# Patient Record
Sex: Female | Born: 1965 | Race: White | Hispanic: No | Marital: Single | Smoking: Never smoker
Health system: Southern US, Community
[De-identification: ages and names within clinical notes are randomized; demographics above are authoritative.]

## PROBLEM LIST (undated history)

## (undated) DIAGNOSIS — R011 Cardiac murmur, unspecified: Secondary | ICD-10-CM

## (undated) DIAGNOSIS — E785 Hyperlipidemia, unspecified: Secondary | ICD-10-CM

## (undated) DIAGNOSIS — R911 Solitary pulmonary nodule: Secondary | ICD-10-CM

## (undated) DIAGNOSIS — I2510 Atherosclerotic heart disease of native coronary artery without angina pectoris: Secondary | ICD-10-CM

## (undated) DIAGNOSIS — K760 Fatty (change of) liver, not elsewhere classified: Secondary | ICD-10-CM

## (undated) HISTORY — DX: Fatty (change of) liver, not elsewhere classified: K76.0

## (undated) HISTORY — DX: Solitary pulmonary nodule: R91.1

## (undated) HISTORY — DX: Atherosclerotic heart disease of native coronary artery without angina pectoris: I25.10

## (undated) HISTORY — DX: Cardiac murmur, unspecified: R01.1

## (undated) HISTORY — DX: Hyperlipidemia, unspecified: E78.5

---

## 2009-03-18 ENCOUNTER — Ambulatory Visit: Payer: Self-pay | Admitting: Sports Medicine

## 2009-03-18 DIAGNOSIS — M25579 Pain in unspecified ankle and joints of unspecified foot: Secondary | ICD-10-CM

## 2009-03-18 DIAGNOSIS — M2140 Flat foot [pes planus] (acquired), unspecified foot: Secondary | ICD-10-CM | POA: Insufficient documentation

## 2009-04-23 DIAGNOSIS — R269 Unspecified abnormalities of gait and mobility: Secondary | ICD-10-CM

## 2009-04-28 ENCOUNTER — Encounter: Payer: Self-pay | Admitting: Sports Medicine

## 2009-09-28 ENCOUNTER — Ambulatory Visit: Payer: Self-pay | Admitting: Sports Medicine

## 2009-10-26 ENCOUNTER — Ambulatory Visit: Payer: Self-pay | Admitting: Sports Medicine

## 2009-12-17 ENCOUNTER — Encounter: Payer: Self-pay | Admitting: Sports Medicine

## 2009-12-23 ENCOUNTER — Other Ambulatory Visit: Admission: RE | Admit: 2009-12-23 | Discharge: 2009-12-23 | Payer: Self-pay | Admitting: Family Medicine

## 2010-01-24 ENCOUNTER — Ambulatory Visit (HOSPITAL_COMMUNITY): Admission: RE | Admit: 2010-01-24 | Discharge: 2010-01-24 | Payer: Self-pay | Admitting: Family Medicine

## 2010-09-13 NOTE — Assessment & Plan Note (Signed)
Summary: Denise James,MC   Vital Signs:  Patient profile:   45 year old female Height:      66 inches Weight:      120 pounds BP sitting:   96 / 68  Vitals Entered By: Lillia Pauls CMA (September 28, 2009 11:06 AM)  History of Present Illness: Pt returns because of bilateral foot pain since July of 2010. She was last seen for achilles tendinitis but feels that this has gotten better. However, her pain is more generalized throughout her heels bilaterally including along the plantar surface of her heel and throughout her medial and lateral maleoli. She is unable to find a particularly painful region. She is now unable to run at all because of this shooting pain that usually occurs after she sits down and rests after a run. When she rests completely, she does not have the pain but as soon as she starts to run again, the pain returns. She denies any recent injuries. She recently compoeted a half marathon and iced after that to try to prevent the pain. She has not taken any medications for the pain. She is using the same type of shoes. Wants to do the [facility] marathon in October.   Physical Exam  General:  alert and well-developed.   Head:  normocephalic and atraumatic.   Msk:  Right Foot and Ankle: No bony abnormalities, edema or bruising Full ROM of her ankle No TTP specifically throughout. No plantar or achilles tendon tenderness that is significant Neg tap test along medial and lateral maleoli No TTP along the talofibular ligament 5/5 strength with resisted ROM Neg anterior drawer sign, neg talar tilt test Able to walk without pain  Left Foot and Ankle: No bony abnormalities, edema or bruising Full ROM of her ankle No TTP specifically throughout. No plantar or achilles tendon tenderness that is significant Neg tap test along medial and lateral maleoli No TTP along the talofibular ligament 5/5 strength with resisted ROM Neg anterior drawer sign, neg talar tilt test Able to walk without  pain  Bilateral mild pes planus Left foot slightly suppinates during running gait. Otherwise, good running form.    Impression & Recommendations:  Problem # 1:  ANKLE PAIN, BILATERAL (ICD-719.47) Assessment Unchanged Likley from mechanical stresses during running 1. Will set up for custom orthotics since she is already in sports insoles but may need further support 2. Ice feet as needed for 20 minutes after running 3. Can take OTC NSAIDS as needed 4. Do calf and ankle strengthening exercises as directed  Problem # 2:  PES PLANUS (ICD-734) Mild but is contributing to her persistent pain during running 1. Correct with custom orthotics  Problem # 3:  ABNORMALITY OF GAIT (ICD-781.2) Slight supination of left foot when running 1. Custom orthotics  Patient Instructions: 1)  Do the modified running lunges - first start without any weights in your hands then you can add small hand weights (up to 10lbs). KEEP YOUR RUNNING FORM while you are doing the lunges.  Do three sets 10, then work up to three sets of 15. Three times per week. 2)  Do drop squats - lift up your heels and then a sudden downward motion to recreate the look of your knees when you are running. Do this with the weight bar on the back of your neck (behind your head). Do three sets of 10. Build up to 3 sets of 15.  3)  Continue the hip exercises (rotation, leg raises). Daily. 4)  Continue  the calf muscle exercises for your achilles tendon. Daily 5)  Continue your running, and after you finish do some bounding drills where you overstride going forward for 75 yards. Do this 5-8 times after the run 3 times per week.

## 2010-09-13 NOTE — Letter (Signed)
Summary: Generic Letter  [facility]  [address]   [place], [place] [address]   Phone: [phone]  Fax: [phone]    12/17/2009 Medical Director [facility]  reZain Bingman [facility] [address] [place], [place]  [address]    Dear Medical Director:  I am writing this letter to support the appeal of Ms. Lorenso Courier for coverage of her orthotics made at our [facility].  The need for the orthotics was based on the following diagnosis as listed on her visit:  Bilateral Ankle Pain Abnormaility of gait    The biomechanical issues this patient had with persistent pronation are likely causal of her ongoing symptoms.  Pes planus was listed as a primary diagnosis but this was an error and this should have been listed as a physical finding not as a primary diagnosis in spite of fact her long arch breakdown is likely acquired and not congenital.  I hope you will reconsider her appeal for this charge as her visit seems entirely medically appropriate to me.    Sincerely,     Sibyl Parr. Darrick Penna, MD Professor  Manufacturing engineer of [facility]

## 2010-09-13 NOTE — Assessment & Plan Note (Signed)
Summary: ORTHOTICS/MJD   Vital Signs:  Patient profile:   45 year old female BP sitting:   112 / 73  Vitals Entered By: Lillia Pauls CMA (October 26, 2009 10:54 AM)  History of Present Illness: Karon has had chronic ankle and some recurrent achilles pain  she is planning to run marathons and train consistently  she did benefit from temp sports insoles wiht heel lifts no swelling in ankles or AT today no real pain today  comes for custom orthotics to see if these will allow her to keep up running with less ankle and AT pain  Physical Exam  General:  Well-developed,well-nourished,in no acute distress; alert,appropriate and cooperative throughout examination Msk:  Rt foot more pronated than left left slight pronation transverse arch is down slightly but no abnorm calluses AT non tender ankles not swollen today   Impression & Recommendations:  Problem # 1:  ABNORMALITY OF GAIT (ICD-781.2)  Patient was fitted for a standard, cushioned, semi-rigid orthotic.  The orthotic was heated and the patient stood on the orthotic blank positioned on the orthotic stand. The patient was positioned in subtalar neutral position and 10 degrees of ankle dorsiflexion in a weight bearing stance. After completion of molding a stable based was applied to the orthotic blank.   The blank was ground to a stable position for weight bearing. size 8 red cambray base blue small med density EVA posting  none additional orthotic padding none  after completion  gait looks normal neutral pronation some resting supination knees no longer cross midline less excess low ext motion  Orders: [employer], each unit (E4540)  Problem # 2:  ANKLE PAIN, BILATERAL (ICD-719.47)  the orthotics corrected the side to side shift at the ankles they keep her in neutral and block pronation  hopefully this will be helpful  Orders: [employer], each unit (J8119)  Problem # 3:  PES PLANUS  (ICD-734)  Orders: [employer], each unit (L3002)  good correction with orthotics  will reck as needed expect 2 to 3 yrs from orthotics

## 2011-02-23 ENCOUNTER — Other Ambulatory Visit (HOSPITAL_COMMUNITY): Payer: Self-pay | Admitting: Family Medicine

## 2011-02-23 DIAGNOSIS — Z1231 Encounter for screening mammogram for malignant neoplasm of breast: Secondary | ICD-10-CM

## 2011-03-06 ENCOUNTER — Ambulatory Visit (HOSPITAL_COMMUNITY)
Admission: RE | Admit: 2011-03-06 | Discharge: 2011-03-06 | Disposition: A | Discharge status: Home / Self Care | Payer: BC Managed Care – PPO | Source: Ambulatory Visit | Attending: Family Medicine | Admitting: Family Medicine

## 2011-03-06 DIAGNOSIS — Z1231 Encounter for screening mammogram for malignant neoplasm of breast: Secondary | ICD-10-CM

## 2011-03-09 ENCOUNTER — Other Ambulatory Visit: Payer: Self-pay | Admitting: Family Medicine

## 2011-03-09 DIAGNOSIS — R928 Other abnormal and inconclusive findings on diagnostic imaging of breast: Secondary | ICD-10-CM

## 2011-03-15 ENCOUNTER — Other Ambulatory Visit: Payer: BC Managed Care – PPO

## 2011-03-16 ENCOUNTER — Ambulatory Visit
Admission: RE | Admit: 2011-03-16 | Discharge: 2011-03-16 | Disposition: A | Discharge status: Home / Self Care | Payer: BC Managed Care – PPO | Source: Ambulatory Visit | Attending: Family Medicine | Admitting: Family Medicine

## 2011-03-16 DIAGNOSIS — R928 Other abnormal and inconclusive findings on diagnostic imaging of breast: Secondary | ICD-10-CM

## 2012-04-12 ENCOUNTER — Other Ambulatory Visit: Payer: Self-pay | Admitting: Family Medicine

## 2012-04-12 DIAGNOSIS — Z1231 Encounter for screening mammogram for malignant neoplasm of breast: Secondary | ICD-10-CM

## 2012-04-29 ENCOUNTER — Ambulatory Visit
Admission: RE | Admit: 2012-04-29 | Discharge: 2012-04-29 | Disposition: A | Discharge status: Home / Self Care | Payer: BC Managed Care – PPO | Source: Ambulatory Visit | Attending: Family Medicine | Admitting: Family Medicine

## 2012-04-29 DIAGNOSIS — Z1231 Encounter for screening mammogram for malignant neoplasm of breast: Secondary | ICD-10-CM

## 2012-06-14 ENCOUNTER — Other Ambulatory Visit (HOSPITAL_COMMUNITY)
Admission: RE | Admit: 2012-06-14 | Discharge: 2012-06-14 | Disposition: A | Discharge status: Home / Self Care | Payer: BC Managed Care – PPO | Source: Ambulatory Visit | Attending: Family Medicine | Admitting: Family Medicine

## 2012-06-14 DIAGNOSIS — Z01419 Encounter for gynecological examination (general) (routine) without abnormal findings: Secondary | ICD-10-CM | POA: Insufficient documentation

## 2014-10-19 ENCOUNTER — Other Ambulatory Visit: Payer: Self-pay

## 2014-10-19 DIAGNOSIS — Z1231 Encounter for screening mammogram for malignant neoplasm of breast: Secondary | ICD-10-CM

## 2014-11-19 ENCOUNTER — Ambulatory Visit
Admission: RE | Admit: 2014-11-19 | Discharge: 2014-11-19 | Disposition: A | Discharge status: Home / Self Care | Payer: BLUE CROSS/BLUE SHIELD | Source: Ambulatory Visit

## 2014-11-19 ENCOUNTER — Ambulatory Visit: Payer: Self-pay

## 2014-11-19 DIAGNOSIS — Z1231 Encounter for screening mammogram for malignant neoplasm of breast: Secondary | ICD-10-CM

## 2015-10-04 ENCOUNTER — Other Ambulatory Visit (HOSPITAL_COMMUNITY)
Admission: RE | Admit: 2015-10-04 | Discharge: 2015-10-04 | Disposition: A | Discharge status: Home / Self Care | Payer: BLUE CROSS/BLUE SHIELD | Source: Ambulatory Visit | Attending: Family Medicine | Admitting: Family Medicine

## 2015-10-04 ENCOUNTER — Other Ambulatory Visit: Payer: Self-pay | Admitting: Family Medicine

## 2015-10-04 DIAGNOSIS — Z1151 Encounter for screening for human papillomavirus (HPV): Secondary | ICD-10-CM | POA: Diagnosis present

## 2015-10-04 DIAGNOSIS — Z01419 Encounter for gynecological examination (general) (routine) without abnormal findings: Secondary | ICD-10-CM | POA: Insufficient documentation

## 2015-10-05 LAB — CYTOLOGY - PAP

## 2016-02-14 ENCOUNTER — Other Ambulatory Visit: Payer: Self-pay | Admitting: Family Medicine

## 2016-02-14 DIAGNOSIS — Z1231 Encounter for screening mammogram for malignant neoplasm of breast: Secondary | ICD-10-CM

## 2016-02-16 ENCOUNTER — Ambulatory Visit
Admission: RE | Admit: 2016-02-16 | Discharge: 2016-02-16 | Disposition: A | Discharge status: Home / Self Care | Payer: BLUE CROSS/BLUE SHIELD | Source: Ambulatory Visit | Attending: Family Medicine | Admitting: Family Medicine

## 2016-02-16 DIAGNOSIS — Z1231 Encounter for screening mammogram for malignant neoplasm of breast: Secondary | ICD-10-CM

## 2016-10-06 ENCOUNTER — Other Ambulatory Visit: Payer: Self-pay | Admitting: Family Medicine

## 2016-10-06 ENCOUNTER — Other Ambulatory Visit (HOSPITAL_COMMUNITY)
Admission: RE | Admit: 2016-10-06 | Discharge: 2016-10-06 | Disposition: A | Discharge status: Home / Self Care | Payer: PRIVATE HEALTH INSURANCE | Source: Ambulatory Visit | Attending: Family Medicine | Admitting: Family Medicine

## 2016-10-06 DIAGNOSIS — Z1151 Encounter for screening for human papillomavirus (HPV): Secondary | ICD-10-CM | POA: Diagnosis not present

## 2016-10-06 DIAGNOSIS — Z01411 Encounter for gynecological examination (general) (routine) with abnormal findings: Secondary | ICD-10-CM | POA: Diagnosis present

## 2016-10-10 LAB — CYTOLOGY - PAP
Diagnosis: 0
Diagnosis: NEGATIVE
HPV (WINDOPATH): DETECTED — AB

## 2017-08-21 ENCOUNTER — Other Ambulatory Visit: Payer: Self-pay | Admitting: Family Medicine

## 2017-08-21 DIAGNOSIS — Z1231 Encounter for screening mammogram for malignant neoplasm of breast: Secondary | ICD-10-CM

## 2017-09-13 ENCOUNTER — Ambulatory Visit
Admission: RE | Admit: 2017-09-13 | Discharge: 2017-09-13 | Disposition: A | Discharge status: Home / Self Care | Payer: PRIVATE HEALTH INSURANCE | Source: Ambulatory Visit | Attending: Family Medicine | Admitting: Family Medicine

## 2017-09-13 DIAGNOSIS — Z1231 Encounter for screening mammogram for malignant neoplasm of breast: Secondary | ICD-10-CM

## 2017-10-09 ENCOUNTER — Other Ambulatory Visit: Payer: Self-pay | Admitting: Family Medicine

## 2017-10-09 ENCOUNTER — Other Ambulatory Visit (HOSPITAL_COMMUNITY)
Admission: RE | Admit: 2017-10-09 | Discharge: 2017-10-09 | Disposition: A | Discharge status: Home / Self Care | Payer: PRIVATE HEALTH INSURANCE | Source: Ambulatory Visit | Attending: Family Medicine | Admitting: Family Medicine

## 2017-10-09 DIAGNOSIS — Z01411 Encounter for gynecological examination (general) (routine) with abnormal findings: Secondary | ICD-10-CM | POA: Insufficient documentation

## 2017-10-11 LAB — CYTOLOGY - PAP: HPV: DETECTED — AB

## 2017-10-29 ENCOUNTER — Other Ambulatory Visit: Payer: Self-pay | Admitting: Obstetrics and Gynecology

## 2018-01-28 ENCOUNTER — Other Ambulatory Visit: Payer: Self-pay | Admitting: Family Medicine

## 2018-01-28 DIAGNOSIS — R1011 Right upper quadrant pain: Secondary | ICD-10-CM

## 2018-02-04 ENCOUNTER — Ambulatory Visit
Admission: RE | Admit: 2018-02-04 | Discharge: 2018-02-04 | Disposition: A | Discharge status: Home / Self Care | Payer: PRIVATE HEALTH INSURANCE | Source: Ambulatory Visit | Attending: Family Medicine | Admitting: Family Medicine

## 2018-02-04 DIAGNOSIS — R1011 Right upper quadrant pain: Secondary | ICD-10-CM

## 2018-02-05 ENCOUNTER — Other Ambulatory Visit: Payer: PRIVATE HEALTH INSURANCE

## 2018-11-04 ENCOUNTER — Other Ambulatory Visit: Payer: Self-pay | Admitting: Family Medicine

## 2018-11-04 ENCOUNTER — Other Ambulatory Visit: Payer: Self-pay | Admitting: Obstetrics and Gynecology

## 2018-11-04 ENCOUNTER — Other Ambulatory Visit (HOSPITAL_COMMUNITY)
Admission: RE | Admit: 2018-11-04 | Discharge: 2018-11-04 | Disposition: A | Discharge status: Home / Self Care | Payer: PRIVATE HEALTH INSURANCE | Source: Ambulatory Visit | Attending: Obstetrics and Gynecology | Admitting: Obstetrics and Gynecology

## 2018-11-04 DIAGNOSIS — Z1231 Encounter for screening mammogram for malignant neoplasm of breast: Secondary | ICD-10-CM

## 2018-11-04 DIAGNOSIS — Z01419 Encounter for gynecological examination (general) (routine) without abnormal findings: Secondary | ICD-10-CM | POA: Insufficient documentation

## 2018-11-06 LAB — CYTOLOGY - PAP
Diagnosis: UNDETERMINED — AB
HPV: DETECTED — AB

## 2018-12-10 ENCOUNTER — Other Ambulatory Visit: Payer: Self-pay | Admitting: Obstetrics and Gynecology

## 2018-12-27 ENCOUNTER — Ambulatory Visit: Payer: PRIVATE HEALTH INSURANCE

## 2019-01-17 ENCOUNTER — Other Ambulatory Visit: Payer: Self-pay

## 2019-01-17 ENCOUNTER — Ambulatory Visit
Admission: RE | Admit: 2019-01-17 | Discharge: 2019-01-17 | Disposition: A | Discharge status: Home / Self Care | Payer: PRIVATE HEALTH INSURANCE | Source: Ambulatory Visit | Attending: Family Medicine | Admitting: Family Medicine

## 2019-01-17 DIAGNOSIS — Z1231 Encounter for screening mammogram for malignant neoplasm of breast: Secondary | ICD-10-CM

## 2019-12-06 ENCOUNTER — Ambulatory Visit: Payer: Self-pay | Attending: Internal Medicine

## 2019-12-06 DIAGNOSIS — Z23 Encounter for immunization: Secondary | ICD-10-CM

## 2019-12-06 NOTE — Progress Notes (Signed)
   Covid-19 Vaccination Clinic  Name:  SEHER SCHLAGEL    MRN: [id number] DOB: Jan 30, 1966  12/06/2019  Ms. Muma was observed post Covid-19 immunization for 15 minutes without incident. She was provided with Vaccine Information Sheet and instruction to access the V-Safe system.   Ms. Vukelich was instructed to call 911 with any severe reactions post vaccine: Marland Kitchen Difficulty breathing  . Swelling of face and throat  . A fast heartbeat  . A bad rash all over body  . Dizziness and weakness   Immunizations Administered    Name Date Dose VIS Date Route   Pfizer COVID-19 Vaccine 12/06/2019 10:54 AM 0.3 mL 10/08/2018 Intramuscular   Manufacturer: [employer], [employer]   Lot: [id number]   NDC: [address]-4

## 2019-12-29 ENCOUNTER — Ambulatory Visit: Payer: Self-pay | Attending: Internal Medicine

## 2019-12-29 DIAGNOSIS — Z23 Encounter for immunization: Secondary | ICD-10-CM

## 2019-12-29 NOTE — Progress Notes (Signed)
   Covid-19 Vaccination Clinic  Name:  Denise James    MRN: [id number] DOB: October 28, 1965  12/29/2019  [name] was observed post Covid-19 immunization for 15 minutes without incident. She was provided with Vaccine Information Sheet and instruction to access the V-Safe system.   [name] was instructed to call 911 with any severe reactions post vaccine: Marland Kitchen Difficulty breathing  . Swelling of face and throat  . A fast heartbeat  . A bad rash all over body  . Dizziness and weakness   Immunizations Administered    Name Date Dose VIS Date Route   Pfizer COVID-19 Vaccine 12/29/2019 11:54 AM 0.3 mL 10/08/2018 Intramuscular   Manufacturer: [employer], [employer]   Lot: [id number]   NDC: [address]-0

## 2020-05-12 ENCOUNTER — Other Ambulatory Visit: Payer: Self-pay | Admitting: Family Medicine

## 2020-05-12 DIAGNOSIS — Z1231 Encounter for screening mammogram for malignant neoplasm of breast: Secondary | ICD-10-CM

## 2020-05-26 ENCOUNTER — Other Ambulatory Visit: Payer: Self-pay

## 2020-05-26 ENCOUNTER — Ambulatory Visit
Admission: RE | Admit: 2020-05-26 | Discharge: 2020-05-26 | Disposition: A | Discharge status: Home / Self Care | Payer: 59 | Source: Ambulatory Visit

## 2020-05-26 DIAGNOSIS — Z1231 Encounter for screening mammogram for malignant neoplasm of breast: Secondary | ICD-10-CM

## 2022-01-13 ENCOUNTER — Other Ambulatory Visit: Payer: Self-pay | Admitting: Obstetrics and Gynecology

## 2022-01-13 ENCOUNTER — Other Ambulatory Visit (HOSPITAL_COMMUNITY)
Admission: RE | Admit: 2022-01-13 | Discharge: 2022-01-13 | Disposition: A | Discharge status: Home / Self Care | Payer: Managed Care, Other (non HMO) | Source: Ambulatory Visit | Attending: Obstetrics and Gynecology | Admitting: Obstetrics and Gynecology

## 2022-01-13 DIAGNOSIS — Z01419 Encounter for gynecological examination (general) (routine) without abnormal findings: Secondary | ICD-10-CM | POA: Insufficient documentation

## 2022-01-17 LAB — CYTOLOGY - PAP
Comment: NEGATIVE
Diagnosis: NEGATIVE
High risk HPV: NEGATIVE

## 2022-07-19 IMAGING — MG DIGITAL SCREENING BILAT W/ TOMO W/ CAD
8 series · 9 of 24 positions shown · non-contrast
Comparison: Previous exam(s).

CLINICAL DATA: Screening.

EXAM:
DIGITAL SCREENING BILATERAL MAMMOGRAM WITH TOMO AND CAD

[R MLO synth-2D]
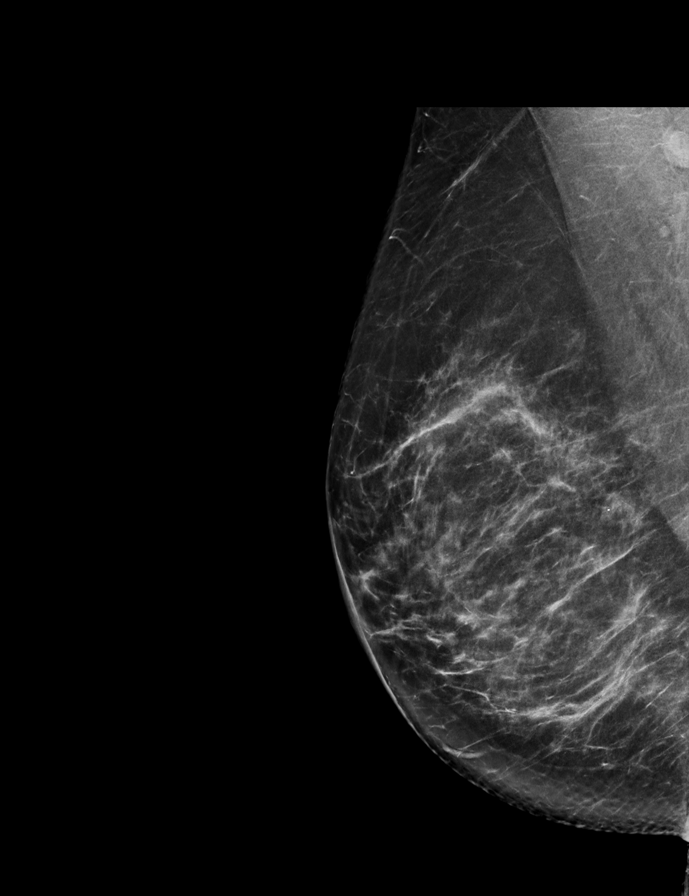

[L MLO synth-2D]
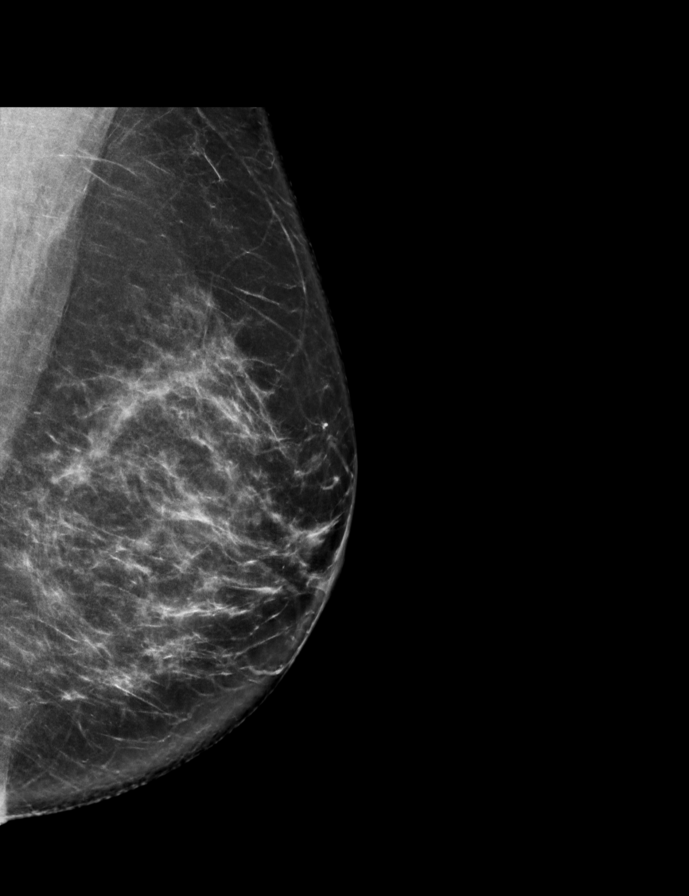

[L CC synth-2D]
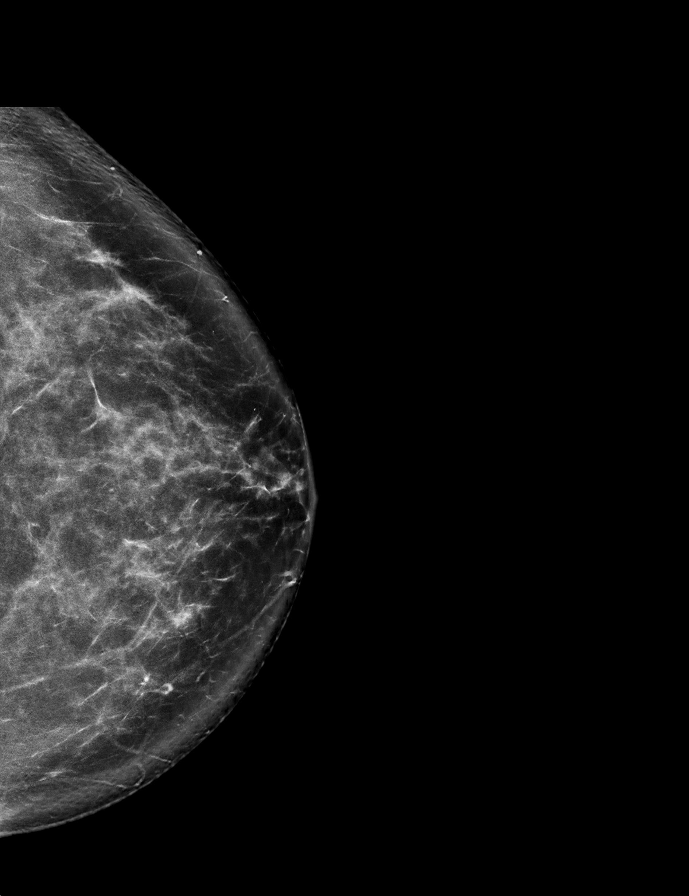

[R CC synth-2D]
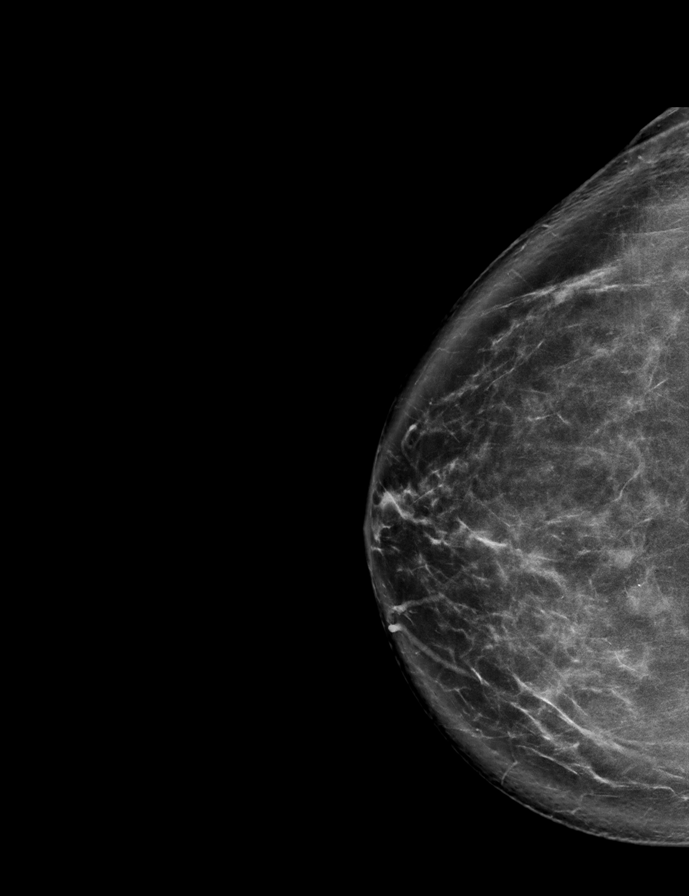

[R CC tomo · 2 of 91 frames shown]
[frame 30/91]
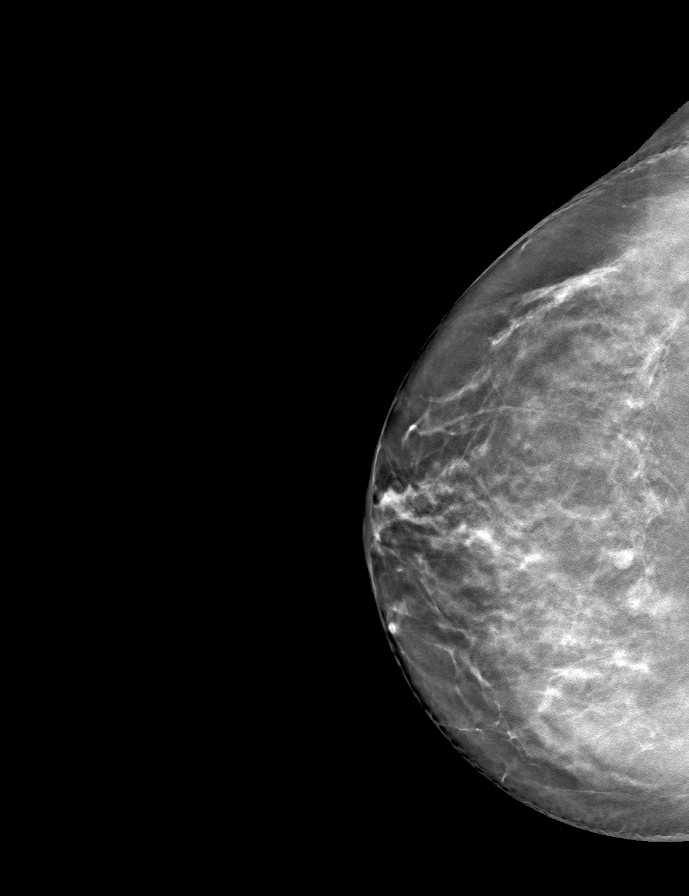
[frame 46/91]
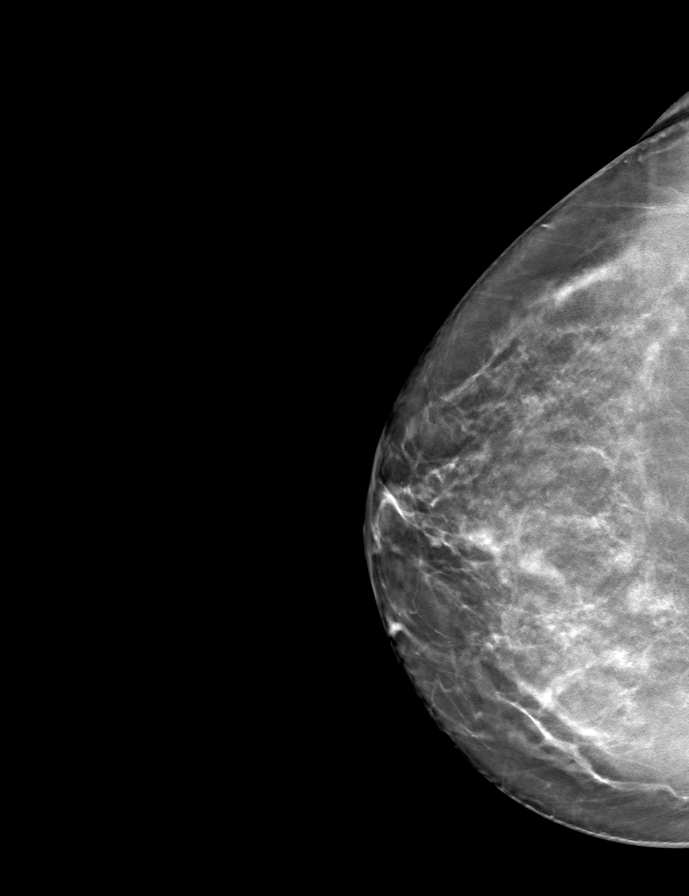

[R MLO tomo · tomo slice 41/80.0]
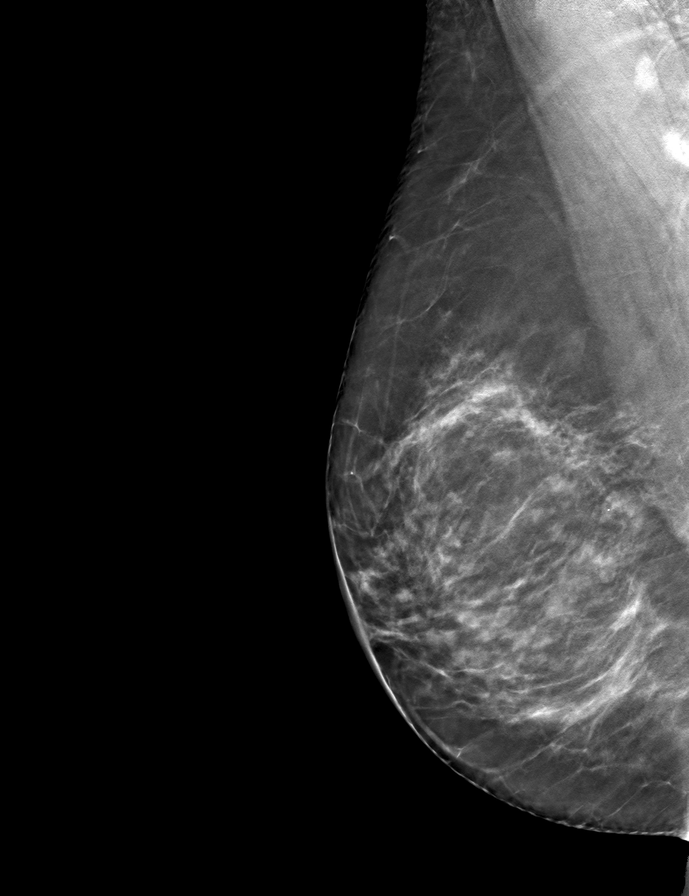

[L MLO tomo · tomo slice 41/81.0]
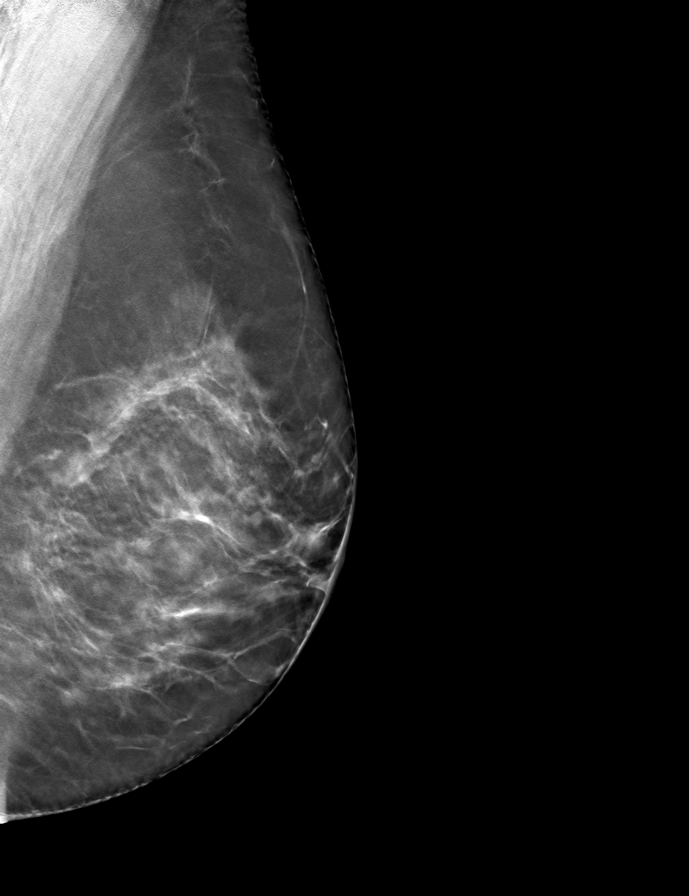

[L CC tomo · tomo slice 43/85.0]
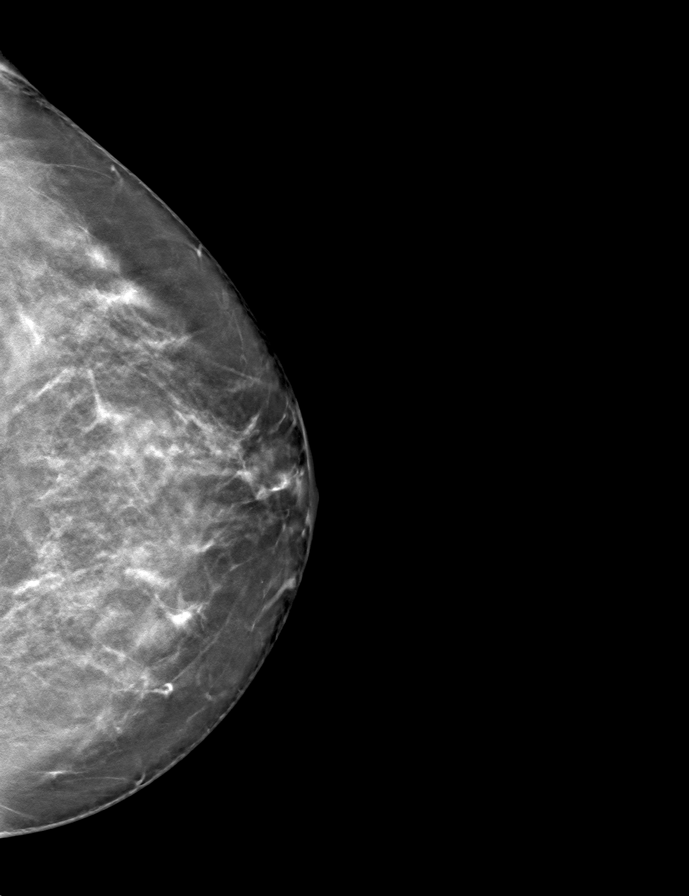

[9 of 24 positions shown; findings below may reference images not displayed]

ACR Breast Density Category b: There are scattered areas of
fibroglandular density.
FINDINGS: There are no findings suspicious for malignancy. Images were
processed with CAD.
IMPRESSION: No mammographic evidence of malignancy. A result letter of this
screening mammogram will be mailed directly to the patient.

RECOMMENDATION:
Screening mammogram in one year. (Code:[id number])

BI-RADS CATEGORY  1: Negative.

## 2023-12-17 ENCOUNTER — Other Ambulatory Visit (HOSPITAL_BASED_OUTPATIENT_CLINIC_OR_DEPARTMENT_OTHER): Payer: Self-pay | Admitting: Family Medicine

## 2023-12-17 DIAGNOSIS — E785 Hyperlipidemia, unspecified: Secondary | ICD-10-CM

## 2023-12-28 ENCOUNTER — Ambulatory Visit (HOSPITAL_COMMUNITY)
Admission: RE | Admit: 2023-12-28 | Discharge: 2023-12-28 | Disposition: A | Discharge status: Home / Self Care | Payer: Self-pay | Source: Ambulatory Visit | Attending: Family Medicine | Admitting: Family Medicine

## 2023-12-28 DIAGNOSIS — E785 Hyperlipidemia, unspecified: Secondary | ICD-10-CM | POA: Insufficient documentation

## 2024-02-29 ENCOUNTER — Other Ambulatory Visit: Payer: Self-pay | Admitting: Family Medicine

## 2024-02-29 DIAGNOSIS — R911 Solitary pulmonary nodule: Secondary | ICD-10-CM

## 2024-04-11 ENCOUNTER — Ambulatory Visit
Admission: RE | Admit: 2024-04-11 | Discharge: 2024-04-11 | Disposition: A | Discharge status: Home / Self Care | Source: Ambulatory Visit | Attending: Family Medicine | Admitting: Family Medicine

## 2024-04-11 ENCOUNTER — Other Ambulatory Visit

## 2024-04-11 DIAGNOSIS — R911 Solitary pulmonary nodule: Secondary | ICD-10-CM

## 2024-04-15 NOTE — Progress Notes (Unsigned)
 Cardiology Office Note:  .   Date:  04/16/2024 ID:  Denise James, DOB [birth year], MRN [id number] PCP: Dyane Anthony RAMAN, FNP [facility] Providers Cardiologist:  None { Click to update primary MD,subspecialty MD or APP then REFRESH:1}   Patient Profile: .      PMH Coronary artery calcification CT Calcium score 12/28/2023 CAC score 43 (84th percentile) LM 0, LAD 0, LCx 0, RCA 43 Fatty liver Pulmonary nodule Hyperlipidemia Insomnia Heart murmur 2005 Echo/stress test revealed trace AI       History of Present Illness: .   Denise James is a *** 58 y.o. female  who is here today for new patient consult for ***  Calcium score of 0 at age 1 Sister has bicuspid aortic valve CRP was high several years ago  Atorvastatin 1 month  Family history: Her family history includes Alzheimer's disease in her father; CAD (age of onset: 32 - 19) in her mother; Heart failure in her mother; Hyperlipidemia in her brother; Valvular heart disease in her sister.  Oldest brother - cardiology work up, all good, on statin  Discussed the use of AI scribe software for clinical note transcription with the patient, who gave verbal consent to proceed.  ASCVD Risk Score: ASCVD (Atherosclerotic Cardiovascular Disease) Risk Algorithm including Known ASCVD from AHA/ACC from [email or web address] on 04/15/2024 ** All calculations should be rechecked by clinician prior to use **  RESULT SUMMARY: 2.0 % Risk of cardiovascular event (coronary or stroke death or non-fatal MI or stroke) in next 10 years.  No statin recommended because 10-year risk <5%; always encourage healthy cardiovascular lifestyle choices. Some patients with other high risk features may still be appropriate for treatment.   INPUTS: History of ASCVD --> 0 = No LDL Cholesterol >=190mg /dL (5.07 mmol/L) --> 0 = No Age --> 57 years Diabetes --> 0 = No Sex --> 0 = Female Total Cholesterol --> 158 mg/dL HDL Cholesterol --> 42 mg/dL Systolic  Blood Pressure --> 105 mm Hg Treatment for Hypertension --> 0 = No Smoker --> 0 = No Race --> 1 = White   The ASCVD Risk score (Arnett DK, et al., 2019) failed to calculate for the following reasons:   Cannot find a previous HDL lab   Cannot find a previous total cholesterol lab  {MD Calc ASCVD Calculator :1}  Diet: Rarely eats out, avoids processed foods Lean protein Admits she needs to increase fruits and vegetables  Activity: Previously ran triathlons [employer] post menopause (as high as 145 lbs) Lots of yard work on weekends [employer] bike occasionally, more consistent when she was training  No results found for: LIPOA   ROS: See HPI       Studies Reviewed: SABRA   EKG Interpretation Date/Time:  Wednesday April 16 2024 09:53:23 EDT Ventricular Rate:  58 PR Interval:  118 QRS Duration:  84 QT Interval:  398 QTC Calculation: 390 R Axis:   37  Text Interpretation: Sinus bradycardia No previous ECGs available Confirmed by Percy Browning [id number]) on 04/16/2024 10:04:48 AM      *** Risk Assessment/Calculations:             Physical Exam:   VS: BP 132/86 (BP Location: Left Arm, Patient Position: Sitting, Cuff Size: Normal)   Pulse (!) 58   Ht 5' 6 (1.676 m)   Wt 133 lb 9.6 oz (60.6 kg)   LMP 02/20/2011   SpO2 100%   BMI 21.56 kg/m   Wt Readings from Last 3  Encounters:  04/16/24 133 lb 9.6 oz (60.6 kg)     GEN: Well nourished, well developed in no acute distress NECK: No JVD; No carotid bruits CARDIAC: ***RRR, no murmurs, rubs, gallops RESPIRATORY:  Clear to auscultation without rales, wheezing or rhonchi  ABDOMEN: Soft, non-tender, non-distended EXTREMITIES:  No edema; No deformity     ASSESSMENT AND PLAN: .     Plan/Goals:{ Click here to update goals :1} 1: Aim to increase physical activity to achieve at least 150 minutes of moderate intensity exercise each week 2: Aim to incorporate weight lifting and resistance training for 20-30 minutes at  least 3 days per week       {Are you ordering a CV Procedure (e.g. stress test, cath, DCCV, TEE, etc)?   Press F2        :[id number]}  Dispo: ***  Signed, Rosaline Bane, NP-C

## 2024-04-16 ENCOUNTER — Ambulatory Visit (HOSPITAL_BASED_OUTPATIENT_CLINIC_OR_DEPARTMENT_OTHER): Admitting: Nurse Practitioner

## 2024-04-16 ENCOUNTER — Encounter (HOSPITAL_BASED_OUTPATIENT_CLINIC_OR_DEPARTMENT_OTHER): Payer: Self-pay | Admitting: Nurse Practitioner

## 2024-04-16 VITALS — BP 132/86 | HR 58 | Ht 66.0 in | Wt 133.6 lb

## 2024-04-16 DIAGNOSIS — I2510 Atherosclerotic heart disease of native coronary artery without angina pectoris: Secondary | ICD-10-CM

## 2024-04-16 DIAGNOSIS — Z7189 Other specified counseling: Secondary | ICD-10-CM

## 2024-04-16 DIAGNOSIS — E785 Hyperlipidemia, unspecified: Secondary | ICD-10-CM | POA: Diagnosis not present

## 2024-04-16 DIAGNOSIS — R011 Cardiac murmur, unspecified: Secondary | ICD-10-CM

## 2024-04-16 DIAGNOSIS — Z7689 Persons encountering health services in other specified circumstances: Secondary | ICD-10-CM

## 2024-04-16 NOTE — Patient Instructions (Signed)
 Medication Instructions:   Your physician recommends that you continue on your current medications as directed. Please refer to the Current Medication list given to you today.  *If you need a refill on your cardiac medications before your next appointment, please call your pharmacy*   Lab Work:  WE RECOMMEND THAT YOU GET A LIPOPROTEIN A CHECKED AT YOUR UPCOMING PCP APPOINTMENT  If you have labs (blood work) drawn today and your tests are completely normal, you will receive your results only by: MyChart Message (if you have MyChart) OR A paper copy in the mail If you have any lab test that is abnormal or we need to change your treatment, we will call you to review the results.   Testing/Procedures:  Your physician has requested that you have an echocardiogram. Echocardiography is a painless test that uses sound waves to create images of your heart. It provides your doctor with information about the size and shape of your heart and how well your heart's chambers and valves are working. This procedure takes approximately one hour. There are no restrictions for this procedure. Please do NOT wear cologne, perfume, aftershave, or lotions (deodorant is allowed). Please arrive 15 minutes prior to your appointment time.  Please note: We ask at that you not bring children with you during ultrasound (echo/ vascular) testing. Due to room size and safety concerns, children are not allowed in the ultrasound rooms during exams. Our front office staff cannot provide observation of children in our lobby area while testing is being conducted. An adult accompanying a patient to their appointment will only be allowed in the ultrasound room at the discretion of the ultrasound technician under special circumstances. We apologize for any inconvenience.   Follow-Up:  3-4 MONTHS WITH ROSALINE BANE, NP IN LIPID CLINIC   Other Instructions  Goals: 1: Aim to increase physical activity to achieve at least  150 minutes of moderate intensity exercise each week 2: Aim to incorporate weight lifting and resistance training for 20-30 minutes at least 3 days per week         1: Aim to increase physical activity to achieve at least 150 minutes of moderate intensity exercise each week 2: Aim to incorporate weight lifting and resistance training for 20-30 minutes at least 3 days per week

## 2024-04-17 ENCOUNTER — Encounter (HOSPITAL_BASED_OUTPATIENT_CLINIC_OR_DEPARTMENT_OTHER): Payer: Self-pay | Admitting: Nurse Practitioner

## 2024-05-09 ENCOUNTER — Ambulatory Visit (INDEPENDENT_AMBULATORY_CARE_PROVIDER_SITE_OTHER)

## 2024-05-09 DIAGNOSIS — R011 Cardiac murmur, unspecified: Secondary | ICD-10-CM | POA: Diagnosis not present

## 2024-05-09 LAB — ECHOCARDIOGRAM COMPLETE
AR max vel: 1.03 cm2
AV Area VTI: 0.97 cm2
AV Area mean vel: 0.89 cm2
AV Mean grad: 17.3 mmHg
AV Peak grad: 30.1 mmHg
AV Vena cont: 0.33 cm
Ao pk vel: 2.74 m/s
Area-P 1/2: 3.83 cm2
P 1/2 time: 498 ms
S' Lateral: 2.43 cm

## 2024-05-12 ENCOUNTER — Ambulatory Visit: Payer: Self-pay | Admitting: Nurse Practitioner

## 2024-05-12 DIAGNOSIS — I359 Nonrheumatic aortic valve disorder, unspecified: Secondary | ICD-10-CM

## 2024-05-13 ENCOUNTER — Telehealth (HOSPITAL_BASED_OUTPATIENT_CLINIC_OR_DEPARTMENT_OTHER): Payer: Self-pay

## 2024-05-13 NOTE — Telephone Encounter (Signed)
Sounds good.  Thanks for update

## 2024-05-13 NOTE — Telephone Encounter (Signed)
 Copied from CRM #[id number]. Topic: Clinical - Lab/Test Results >> May 12, 2024  2:06 PM Benton KIDD wrote: Reason for CRM: patient is requesting asking has both of her ct scans been rescheduledor seen before her appointment and have they been remove  [phone]

## 2024-05-17 NOTE — Progress Notes (Unsigned)
 Patient ID: Denise James MRN: [id number] DOB/AGE: Dec 19, 1965 58 y.o.  Primary Care Physician:Hayes, Anthony RAMAN, FNP Primary Cardiologist: None Jama)  CC:  Aortic valvular disease management     FOCUSED PROBLEM LIST:   Aortic valvular disease Aortic stenosis: AVA 1.03, MG 17.2, V-max 2.74, EF 65 to 70% TTE September 2025 Aortic insufficiency: Moderate, PHT 498, EF 65 to 70% TTE September 2025 Coronary artery calcification CT Calcium score 12/28/2023 CAC score 43 (84th percentile) LM 0, LAD 0, LCx 0, RCA 43 Fatty liver Pulmonary nodule Hyperlipidemia Aortic atherosclerosis Chest CT 2025 Insomnia BMI 03 June 2024:  Patient consents to use of AI scribe. The patient is a 58 year old female with above listed medical problems here for recommendations of her aortic valvular disease.  The patient was seen by cardiology recently due to an elevated calcium score.  She was noted to have a murmur on exam as well.  An echocardiogram demonstrated moderate aortic stenosis and moderate aortic insufficiency.  She is here to discuss further.  She feels well on a daily basis and can perform all necessary activities without limitations. She has a history of participating in marathons and triathlons, although she has not done so in the past eight to nine years. Currently, she exercises by using a [employer] and engaging in heavy yard work for about five to six hours on weekends. No shortness of breath, chest pain, or lightheadedness during these activities.  She notes occasional swelling in her legs, particularly in the evenings, but it resolves by morning. No breathing difficulties while lying flat in bed.  Her family history includes cardiology issues, and her sister has a bicuspid valve, although she has a tricuspid valve. She is currently on a statin since the end of July and has not taken aspirin before. She is scheduled to have her cholesterol checked at the end of the month, which will be  her first test since starting the statin.          Past Medical History:  Diagnosis Date   Coronary artery calcification    Fatty liver    Heart murmur    HLD (hyperlipidemia)    Pulmonary nodule     History reviewed. No pertinent surgical history.  Family History  Problem Relation Age of Onset   CAD Mother 36 - 15       died from heart failure, had stenting   Heart failure Mother    Alzheimer's disease Father    Valvular heart disease Sister    Hyperlipidemia Brother     Social History   Socioeconomic History   Marital status: Single    Spouse name: Not on file   Number of children: Not on file   Years of education: Not on file   Highest education level: Not on file  Occupational History   Not on file  Tobacco Use   Smoking status: Never   Smokeless tobacco: Never  Substance and Sexual Activity   Alcohol use: Not on file   Drug use: Not on file   Sexual activity: Not on file  Other Topics Concern   Not on file  Social History Narrative   Not on file   Social Drivers of Health   Financial Resource Strain: Not on file  Food Insecurity: Not on file  Transportation Needs: Not on file  Physical Activity: Not on file  Stress: Not on file  Social Connections: Not on file  Intimate Partner Violence: Not on file  Prior to Admission medications   Medication Sig Start Date End Date Taking? Authorizing Provider  atorvastatin (LIPITOR) 10 MG tablet Take 10 mg by mouth daily. 03/08/24   [provider]  nitrofurantoin, macrocrystal-monohydrate, (MACROBID) 100 MG capsule Take 100 mg by mouth as needed. 08/14/1986   [provider]  zolpidem (AMBIEN) 10 MG tablet Take 10 mg by mouth at bedtime as needed. 04/24/00   [provider]    Not on File  REVIEW OF SYSTEMS:  General: no fevers/chills/night sweats Eyes: no blurry vision, diplopia, or amaurosis ENT: no sore throat or hearing loss Resp: no cough, wheezing, or hemoptysis CV: no  edema or palpitations GI: no abdominal pain, nausea, vomiting, diarrhea, or constipation GU: no dysuria, frequency, or hematuria Skin: no rash Neuro: no headache, numbness, tingling, or weakness of extremities Musculoskeletal: no joint pain or swelling Heme: no bleeding, DVT, or easy bruising Endo: no polydipsia or polyuria  BP 134/72   Pulse 67   Ht 5' 6 (1.676 m)   Wt 133 lb 6.4 oz (60.5 kg)   LMP 02/20/2011   SpO2 99%   BMI 21.53 kg/m   PHYSICAL EXAM: GEN:  AO x 3 in no acute distress HEENT: normal Dentition: Normal Neck: JVP normal. +2 carotid upstrokes without bruits. No thyromegaly. Lungs: equal expansion, clear bilaterally CV: Apex is discrete and nondisplaced, RRR with 2/6 SEM Abd: soft, non-tender, non-distended; no bruit; positive bowel sounds Ext: no edema, ecchymoses, or cyanosis Vascular: 2+ femoral pulses, 2+ radial pulses       Skin: warm and dry without rash Neuro: CN II-XII grossly intact; motor and sensory grossly intact    DATA AND STUDIES:  EKG: 2025 sinus bradycardia  EKG Interpretation Date/Time:    Ventricular Rate:    PR Interval:    QRS Duration:    QT Interval:    QTC Calculation:   R Axis:      Text Interpretation:          CARDIAC STUDIES: Refer to CV Procedures and Imaging Tabs  No results found for requested labs within last 365 days.   STS RISK CALCULATOR: Pending  NYHA CLASS: 1    ASSESSMENT AND PLAN:   1. Aortic valve disease   2. Coronary artery calcification seen on CAT scan   3. Hyperlipidemia LDL goal <70   4. Aortic atherosclerosis     Aortic valvular disease: Patient has moderate aortic stenosis and moderate aortic insufficiency.  Patient may be interested in [place].  Will plan on echocardiogram in a year or thereabouts Coronary artery calcification: Start aspirin 81 mg, continue atorvastatin 10 mg Hyperlipidemia: Continue atorvastatin 10 mg.  Check lipid panel, LFTs, LP(a) today Aortic Atherosclerosis:  Start aspirin 81 mg, continue atorvastatin 10 mg   I have personally reviewed the patients imaging data as summarized above.  I have reviewed the natural history of aortic stenosis with the patient and family members who are present today. We have discussed the limitations of medical therapy and the poor prognosis associated with symptomatic aortic stenosis. We have also reviewed potential treatment options, including palliative medical therapy, conventional surgical aortic valve replacement, and transcatheter aortic valve replacement. We discussed treatment options in the context of this patient's specific comorbid medical conditions.   All of the patient's questions were answered today. Will make further recommendations based on the results of studies outlined above.   I spent 45 minutes reviewing all clinical data during and prior to this visit including all relevant imaging  studies, laboratories, clinical information from other health systems and prior notes from both Cardiology and other specialties, interviewing the patient, conducting a complete physical examination, and coordinating care in order to formulate a comprehensive and personalized evaluation and treatment plan.   Denise Sieh K Alida Greiner, MD  05/19/2024 11:32 AM    [facility] [address] [place], [place], [place]  [address] Phone: [phone]; Fax: [phone]

## 2024-05-19 ENCOUNTER — Encounter: Payer: Self-pay | Admitting: Internal Medicine

## 2024-05-19 ENCOUNTER — Ambulatory Visit: Attending: Internal Medicine | Admitting: Internal Medicine

## 2024-05-19 VITALS — BP 134/72 | HR 67 | Ht 66.0 in | Wt 133.4 lb

## 2024-05-19 DIAGNOSIS — E785 Hyperlipidemia, unspecified: Secondary | ICD-10-CM | POA: Diagnosis not present

## 2024-05-19 DIAGNOSIS — I359 Nonrheumatic aortic valve disorder, unspecified: Secondary | ICD-10-CM

## 2024-05-19 DIAGNOSIS — I700 Atherosclerosis of aorta: Secondary | ICD-10-CM | POA: Diagnosis not present

## 2024-05-19 DIAGNOSIS — I2510 Atherosclerotic heart disease of native coronary artery without angina pectoris: Secondary | ICD-10-CM

## 2024-05-19 NOTE — Patient Instructions (Signed)
 Medication Instructions:  START Aspirin 81 mg once daily   *If you need a refill on your cardiac medications before your next appointment, please call your pharmacy*  Lab Work: To be completed today: lipid panel, LFT, lipoprotein A  If you have labs (blood work) drawn today and your tests are completely normal, you will receive your results only by: MyChart Message (if you have MyChart) OR A paper copy in the mail If you have any lab test that is abnormal or we need to change your treatment, we will call you to review the results.  Testing/Procedures: None ordered today.  Follow-Up: At [facility], you and your health needs are our priority.  As part of our continuing mission to provide you with exceptional heart care, our providers are all part of one team.  This team includes your primary Cardiologist (physician) and Advanced Practice Providers or APPs (Physician Assistants and Nurse Practitioners) who all work together to provide you with the care you need, when you need it.  Your next appointment:   9 month(s)  Provider:   Lurena Red, MD

## 2024-05-20 ENCOUNTER — Ambulatory Visit: Payer: Self-pay | Admitting: Internal Medicine

## 2024-05-20 DIAGNOSIS — R7989 Other specified abnormal findings of blood chemistry: Secondary | ICD-10-CM

## 2024-05-20 LAB — HEPATIC FUNCTION PANEL
ALT: 49 IU/L — ABNORMAL HIGH (ref 0–32)
AST: 34 IU/L (ref 0–40)
Albumin: 4.3 g/dL (ref 3.8–4.9)
Alkaline Phosphatase: 145 IU/L — ABNORMAL HIGH (ref 49–135)
Bilirubin Total: 0.4 mg/dL (ref 0.0–1.2)
Bilirubin, Direct: 0.12 mg/dL (ref 0.00–0.40)
Total Protein: 7.4 g/dL (ref 6.0–8.5)

## 2024-05-20 LAB — LIPOPROTEIN A (LPA): Lipoprotein (a): 286.8 nmol/L — ABNORMAL HIGH (ref <75.0)

## 2024-05-20 LAB — LIPID PANEL
Chol/HDL Ratio: 2.9 ratio (ref 0.0–4.4)
Cholesterol, Total: 155 mg/dL (ref 100–199)
HDL: 54 mg/dL (ref >39)
LDL Chol Calc (NIH): 90 mg/dL (ref 0–99)
Triglycerides: 54 mg/dL (ref 0–149)
VLDL Cholesterol Cal: 11 mg/dL (ref 5–40)

## 2024-05-21 MED ORDER — ATORVASTATIN CALCIUM 20 MG PO TABS: 20.0000 mg | ORAL_TABLET | Freq: Every day | ORAL | 3 refills | Status: AC

## 2024-05-21 NOTE — Progress Notes (Unsigned)
 New Patient Pulmonology Office Visit   Subjective:  Patient ID: Denise James, female    DOB: 1965/12/15  MRN: [id number]  Referred by: Dyane Anthony RAMAN, FNP  CC: No chief complaint on file.   HPI Denise James is a 58 y.o. female who presents for initial consultation of multiple pulmonary nodules.  Symptoms Associated with Lung cancer:   {Central Tumor Sx:33645}  {Peripheral Tumor Sx:33646}  {Sx of Metastasis:33647}  {Conditions associated with lung cancer & imp to identify prior to bronch:33648}  {STOPBANG:33649}  {Hx of Anesthesia reactions:33650}  PMH:   Important Medications:   Allergies:   Social History:  {Smoking and Biomass Fuel Exposure:33651}  {Occupational Exposures:33652}  {Military Specific Exposures:33653}  Family History: {Cancer-related QY:66345}  ASA grade:  {ASA GRADE:110003}  Karnofsky Performance Status: {Karnofsky Performance Status:33655}  ECOG Performance Status: {findings; ecog performance status:31780}   {PULM QUESTIONNAIRES (Optional):33196}  ROS  Allergies: Patient has no allergy information on record.  Current Outpatient Medications:    atorvastatin (LIPITOR) 20 MG tablet, Take 1 tablet (20 mg total) by mouth daily., Disp: 90 tablet, Rfl: 3   nitrofurantoin, macrocrystal-monohydrate, (MACROBID) 100 MG capsule, Take 100 mg by mouth as needed., Disp: , Rfl:    zolpidem (AMBIEN) 10 MG tablet, Take 10 mg by mouth at bedtime as needed., Disp: , Rfl:  Past Medical History:  Diagnosis Date   Coronary artery calcification    Fatty liver    Heart murmur    HLD (hyperlipidemia)    Pulmonary nodule    No past surgical history on file. Family History  Problem Relation Age of Onset   CAD Mother 59 - 79       died from heart failure, had stenting   Heart failure Mother    Alzheimer's disease Father    Valvular heart disease Sister    Hyperlipidemia Brother    Social History   Socioeconomic History   Marital  status: Single    Spouse name: Not on file   Number of children: Not on file   Years of education: Not on file   Highest education level: Not on file  Occupational History   Not on file  Tobacco Use   Smoking status: Never   Smokeless tobacco: Never  Substance and Sexual Activity   Alcohol use: Not on file   Drug use: Not on file   Sexual activity: Not on file  Other Topics Concern   Not on file  Social History Narrative   Not on file   Social Drivers of Health   Financial Resource Strain: Not on file  Food Insecurity: Not on file  Transportation Needs: Not on file  Physical Activity: Not on file  Stress: Not on file  Social Connections: Not on file  Intimate Partner Violence: Not on file       Objective:  LMP 02/20/2011  {Pulm Vitals (Optional):32837}  Physical Exam  Diagnostic Review:  {Labs (Optional):32838}  CT Chest 04/11/2024: IMPRESSION: 1. Scattered small bilateral pulmonary nodules which were imaged in the field of view on coronary examination dated 12/28/2023 are unchanged, largest in the lateral segment right middle lobe measuring 0.6 cm. Several additional nodules in the upper lobes were not included in the field of view of prior examination and measure up to 0.6 cm. Non-contrast chest CT at 3-6 months is recommended. If the nodules are stable at time of repeat CT, then future CT at 18-24 months (from today's scan) is considered optional for low-risk patients, but  is recommended for high-risk patients. This recommendation follows the consensus statement: Guidelines for Management of Incidental Pulmonary Nodules Detected on CT Images: From the Fleischner Society 2017; Radiology 2017; 284:228-243. Consider annual low-dose CT lung cancer screening if indicated by patient age, smoking history, and/or other risk factors for lung cancer. 2. Background of fine centrilobular nodularity throughout the lungs, most concentrated in the lung apices, nonspecific  and infectious or inflammatory although most commonly seen in smoking-related respiratory bronchiolitis. 3. Coronary artery disease.  CT Cardiac 12/28/2023: IMPRESSION: Multiple pulmonary nodules. Most significant: Right solid pulmonary nodule measuring 6 mm.Per Fleischner Society Guidelines, recommend a non-contrast Chest CT at 3-6 months, then consider another non-contrast Chest CT at 18-24 months. If patient is low risk for malignancy, non-contrast Chest CT at 18-24 months is optional.These guidelines do not apply to immunocompromised patients and patients with cancer. Follow up in patients with significant comorbidities as clinically warranted. For lung cancer screening, adhere to Lung-RADS guidelines. Reference: Radiology. 2017; 284(1):228-43.    Assessment & Plan:   Assessment & Plan   No orders of the defined types were placed in this encounter.     No follow-ups on file.   Denise Drennen, MD

## 2024-05-22 ENCOUNTER — Encounter (HOSPITAL_BASED_OUTPATIENT_CLINIC_OR_DEPARTMENT_OTHER): Payer: Self-pay | Admitting: Pulmonary Disease

## 2024-05-22 ENCOUNTER — Ambulatory Visit (HOSPITAL_BASED_OUTPATIENT_CLINIC_OR_DEPARTMENT_OTHER): Admitting: Pulmonary Disease

## 2024-05-22 VITALS — BP 148/75 | HR 60 | Ht 66.0 in | Wt 137.0 lb

## 2024-05-22 DIAGNOSIS — R918 Other nonspecific abnormal finding of lung field: Secondary | ICD-10-CM | POA: Diagnosis not present

## 2024-05-22 NOTE — Patient Instructions (Addendum)
-   Will repeat CT chest in a year - I'll see you in follow up    VISIT SUMMARY: During your visit, we discussed the incidental finding of lung nodules on your recent CT scans. These nodules are stable and you are currently asymptomatic, with a low risk of lung cancer.  YOUR PLAN: STABLE BILATERAL PULMONARY NODULES: You have two 6 mm nodules in your lungs that have remained stable for over three months. These nodules are not causing any symptoms and your risk of lung cancer is low. -We will follow the Fleischner criteria for low-risk individuals. -A repeat CT chest scan is scheduled in one year at [facility], [address]. -If your previous CT scans from [facility] (2004-2005) show similar nodules, we may extend the surveillance interval. -Please contact medical records in [place] to obtain your previous CT scan records. -If you have any new scans performed for cardiac evaluation, we will review them for changes in the pulmonary nodules.                      Contains text generated by Abridge.                                 Contains text generated by Abridge.

## 2024-07-14 NOTE — Progress Notes (Unsigned)
 Cardiology Office Note:  .   Date:  07/16/2024 ID:  Denise James, DOB August 17, 1965, MRN [id number] PCP: Dyane Anthony RAMAN, FNP [facility] Providers Cardiologist:  None   Patient Profile: .      PMH Coronary artery calcification CT Calcium  score 12/28/2023 CAC score 43 (84th percentile) LM 0, LAD 0, LCx 0, RCA 43 Fatty liver Elevated LP(a) Pulmonary nodule Hyperlipidemia Insomnia Aortic atherosclerosis Aortic valve disease Aortic stenosis: AVA 1.03, MG 17.2, V-max 2.74, EF 65-70% TTE September 2025 Aortic valve insufficiency: Moderate: PHT 498, EF 65-70% TTE September 2025  Referred to cardiology and seen by me on 04/16/2024 for elevated calcium  CT score and family history of cardiac issues. CT calcium  score of 43 (84th percentile) with calcification and RCA; previously had calcium  score when she was 38 which was 0.  She recalls having an elevated CRP at that time but does not recall any additional follow-up for that. She has been on atorvastatin  10 mg daily for about a month without significant side effects. Heart murmur since birth, underwent stress test in 2005. It appears she had a stress echo per her records, but there is nothing to identify the cause of the murmur. Her sister has a bicuspid aortic valve and has not undergone surgery at this point. Family history is significant for cardiac issues. Her mother had coronary artery disease and heart failure, and passed away at age 84. Her brother had some health issues, but cardiology work up was unremarkable. He is also on statin. She sometimes feels the need to 'get more air in' during exertion but denies chest pain, dyspnea, orthopnea, PND, palpitations, presyncope, syncope. 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 She occasionally rides her Peloton or walks for exercise. She particularly enjoys doing yard work on the weekends but admits she is pretty sedentary during the week. Diet is overall pretty  healthy and is centered on lean protein. Admits she could increase intake of fruits and vegetables but she avoids processed food and rarely eats out.  Echo 05/09/24 revealed moderate aortic stenosis and aortic insufficiency.  She was referred to Dr. Wendel for evaluation on 05/19/2024 and was advised to start aspirin 81 mg daily, continue atorvastatin  10 mg daily, and plan to return in 1 year for repeat echo.       History of Present Illness: .   Discussed the use of AI scribe software for clinical note transcription with the patient, who gave verbal consent to proceed.  History of Present Illness Denise James is a very pleasant 58 year old female who presents for follow-up of hyperlipidemia and coronary artery calcification. She is being monitored by Dr. Wendel for aortic valve regurgitation and stenosis, with a baseline echocardiogram obtained in September 2025.  We discussed the plan for follow-up in September 2026 with repeat echocardiogram prior. She has elevated lipoprotein A and is on atorvastatin , recently increased from 10 mg to 20 mg, to further lower LDL cholesterol. She denies chest pain, shortness of breath, edema, fatigue, palpitations, presyncope, syncope, orthopnea, and PND.  She underwent evaluation by pulmonology for 2 lung nodules which were felt to be stable.  She will undergo repeat chest CT in 6 months.  No concerning side effects on cardiac medications.    Lipoprotein (a)  Date/Time Value Ref Range Status  05/19/2024 12:54 PM 286.8 (H) <75.0 nmol/L Final    Comment:    **Results verified by repeat testing** Note:  Values greater than or  equal to 75.0 nmol/L may        indicate an independent risk factor for CHD,        but must be evaluated with caution when applied        to non-Caucasian populations due to the        influence of genetic factors on Lp(a) across        ethnicities.      ROS: See HPI       Studies Reviewed: .          Risk  Assessment/Calculations:             Physical Exam:   VS: BP 132/74   Pulse 61   Ht 5' 6 (1.676 m)   Wt 137 lb 11.2 oz (62.5 kg)   LMP 02/20/2011   SpO2 98%   BMI 22.23 kg/m   Wt Readings from Last 3 Encounters:  07/16/24 137 lb 11.2 oz (62.5 kg)  05/22/24 137 lb (62.1 kg)  05/19/24 133 lb 6.4 oz (60.5 kg)     GEN: Well nourished, well developed in no acute distress NECK: No JVD; No carotid bruits CARDIAC: RRR, systolic murmur, no rubs, gallops RESPIRATORY:  Clear to auscultation without rales, wheezing or rhonchi  ABDOMEN: Soft, non-tender, non-distended EXTREMITIES:  No edema; No deformity     ASSESSMENT AND PLAN: .    Assessment & Plan Coronary artery calcification Cardiac risk  Coronary artery calcification in the right coronary artery with a calcium  score of 43 (84th percentile). Family history of CAD and heart failure in her mother. ASCVD risk score is 2%.  Echo 05/09/2024 revealed normal heart function, no regional wall motion abnormality, normal diastolic function, normal RV, valve disease as noted below. No history of hypertension or diabetes and has never smoked.  BP is well-controlled. We discussed concerning symptoms to report. She denies chest pain, dyspnea, or other symptoms concerning for angina.  No indication for further ischemic evaluation at this time. Diet is overall healthy but he wants to focus on potential improvements. Lipid panel 05/19/2024 revealed LDL-C of 90, goal LDL 70 or lower. We will recheck lipids in 3 weeks.  Lengthy discussion about potentially starting PCSK9 inhibitor therapy for management of elevated LP(a) and hyperlipidemia. - Consider ischemia evaluation if symptoms concerning for angina develop - Continue atorvastatin  -Maintain a healthy lifestyle with optimal blood pressure, glucose, and lipid control  - Aim for at least 150 minutes of moderate intensity exercise each week - Heart healthy diet avoiding saturated fat, processed foods,  sugar, and other simple carbohydrates  Aortic valve disease History of sister who has a bicuspid aortic valve but has not undergone surgery.  TTE 05/09/2024 revealed moderate aortic stenosis and moderate aortic insufficiency, EF 65 to 70%.  She was referred to Dr. Wendel for management of valve disease.  She is asymptomatic.  We reviewed plan for repeat monitoring in 1 year.  Advised symptoms to report. - Repeat echo 04/2025  Hyperlipidemia LDL goal < 70 Elevated ALT Elevated LP(a) Lipid panel completed 05/19/2024 with total cholesterol 155, triglycerides 54, HDL 54, LDL-C 90.  Slight decrease in LDL from 107 on 12/13/2023.  She was taking atorvastatin  10 mg daily which was increased to 20 mg daily by Dr. Wendel.  ALT mildly elevated at 49.  She also has elevated LP(a) at 286.8 nmol/L which is likely contributing to aortic valve calcification.  Discussed potentially starting PCSK9 inhibitor therapy for significantly elevated LP(a) and LDL above goal.  Information given for her to review on additional lipid-lowering therapy options. -Continue atorvastatin  10 mg daily -Check lipid levels and LFTs in 3 weeks to assess response to atorvastatin  - Consider additional lipid lowering therapy, specifically PCSK9i given elevated LP(a), if LDL remains above goal         Disposition: September 2026 with Dr. Wendel  Signed, Rosaline Bane, NP-C

## 2024-07-16 ENCOUNTER — Ambulatory Visit (INDEPENDENT_AMBULATORY_CARE_PROVIDER_SITE_OTHER): Admitting: Nurse Practitioner

## 2024-07-16 ENCOUNTER — Other Ambulatory Visit (HOSPITAL_BASED_OUTPATIENT_CLINIC_OR_DEPARTMENT_OTHER): Payer: Self-pay | Admitting: *Deleted

## 2024-07-16 ENCOUNTER — Encounter (HOSPITAL_BASED_OUTPATIENT_CLINIC_OR_DEPARTMENT_OTHER): Payer: Self-pay | Admitting: Nurse Practitioner

## 2024-07-16 VITALS — BP 132/74 | HR 61 | Ht 66.0 in | Wt 137.7 lb

## 2024-07-16 DIAGNOSIS — I2510 Atherosclerotic heart disease of native coronary artery without angina pectoris: Secondary | ICD-10-CM

## 2024-07-16 DIAGNOSIS — I359 Nonrheumatic aortic valve disorder, unspecified: Secondary | ICD-10-CM

## 2024-07-16 DIAGNOSIS — R011 Cardiac murmur, unspecified: Secondary | ICD-10-CM

## 2024-07-16 DIAGNOSIS — Z7189 Other specified counseling: Secondary | ICD-10-CM

## 2024-07-16 DIAGNOSIS — E7841 Elevated Lipoprotein(a): Secondary | ICD-10-CM

## 2024-07-16 DIAGNOSIS — E785 Hyperlipidemia, unspecified: Secondary | ICD-10-CM

## 2024-07-16 DIAGNOSIS — R7401 Elevation of levels of liver transaminase levels: Secondary | ICD-10-CM

## 2024-07-16 NOTE — Patient Instructions (Signed)
 Medication Instructions:   Your physician recommends that you continue on your current medications as directed. Please refer to the Current Medication list given to you today.   *If you need a refill on your cardiac medications before your next appointment, please call your pharmacy*  Lab Work:  Your physician recommends that you return for a FASTING NMR/LFT, patient given paperwork today.   If you have labs (blood work) drawn today and your tests are completely normal, you will receive your results only by: MyChart Message (if you have MyChart) OR A paper copy in the mail If you have any lab test that is abnormal or we need to change your treatment, we will call you to review the results.  Testing/Procedures:  Your physician has requested that you have an echocardiogram. Echocardiography is a painless test that uses sound waves to create images of your heart. It provides your doctor with information about the size and shape of your heart and how well your heart's chambers and valves are working. This procedure takes approximately one hour. There are no restrictions for this procedure. Please do NOT wear cologne, perfume or lotions (deodorant is allowed). Please arrive 15 minutes prior to your appointment time.   Follow-Up: At [facility], you and your health needs are our priority.  As part of our continuing mission to provide you with exceptional heart care, our providers are all part of one team.  This team includes your primary Cardiologist (physician) and Advanced Practice Providers or APPs (Physician Assistants and Nurse Practitioners) who all work together to provide you with the care you need, when you need it.  Your next appointment:   As needed.   Provider:   Rosaline Bane, NP    We recommend signing up for the patient portal called MyChart.  Sign up information is provided on this After Visit Summary.  MyChart is used to connect with patients for Virtual  Visits (Telemedicine).  Patients are able to view lab/test results, encounter notes, upcoming appointments, etc.  Non-urgent messages can be sent to your provider as well.   To learn more about what you can do with MyChart, go to [email or web address].   Adopting a Healthy Lifestyle.   Weight: Know what a healthy weight is for you (roughly BMI <25) and aim to maintain this. You can calculate your body mass index on your smart phone. Unfortunately, this is not the most accurate measure of healthy weight, but it is the simplest measurement to use. A more accurate measurement involves body scanning which measures lean muscle, fat tissue and bony density. We do not have this equipment at [facility]    Diet: Aim for 7+ servings of fruits and vegetables daily Limit animal fats in diet for cholesterol and heart health - choose grass fed whenever available Avoid highly processed foods (fast food burgers, tacos, fried chicken, pizza, hot dogs, french fries)  Saturated fat comes in the form of butter, lard, coconut oil, margarine, partially hydrogenated oils, dairy products, and fat in meat. These increase your risk of cardiovascular disease.  Use healthy plant oils, such as olive, canola, soy, corn, sunflower and peanut.  Whole foods such as fruits, vegetables and whole grains have fiber  Men need > 38 grams of fiber per day Women need > 25 grams of fiber per day  Load up on vegetables and fruits - one-half of your plate: Aim for color and variety, and remember that potatoes dont count. Go for whole grains - one-quarter of your  plate: Whole wheat, barley, wheat berries, quinoa, oats, brown rice, and foods made with them. If you want pasta, go with whole wheat pasta. Protein power - one-quarter of your plate: Fish, chicken, beans, and nuts are all healthy, versatile protein sources. Limit red meat. You need carbohydrates for energy! The type of carbohydrate is more important than the amount. Choose  carbohydrates such as vegetables, fruits, whole grains, beans, and nuts in the place of white rice, white pasta, potatoes (baked or fried), macaroni and cheese, cakes, cookies, and donuts.  If youre thirsty, drink water. Coffee and tea are good in moderation, but skip sugary drinks and limit milk and dairy products to one or two daily servings. Keep sugar intake at 6 teaspoons or 24 grams or LESS       Exercise: Aim for 150 min of moderate intensity exercise weekly for heart health, and weights twice weekly for bone health Stay active - any steps are better than no steps! Aim for 7-9 hours of sleep daily   Sleep: This provides your body with the reset and relaxation that it needs!  Aim to get 7-8 hours of sleep each night. Limit caffeine, screen time, and other distractions prior to bedtime.  Keep your bedroom cool and dark and do not wear heavy clothing to bed or use heavy bed covers - layer if needed.

## 2024-08-13 LAB — HEPATIC FUNCTION PANEL
ALT: 29 IU/L (ref 0–32)
AST: 27 IU/L (ref 0–40)
Albumin: 4.2 g/dL (ref 3.8–4.9)
Alkaline Phosphatase: 117 IU/L (ref 49–135)
Bilirubin Total: 0.5 mg/dL (ref 0.0–1.2)
Bilirubin, Direct: 0.15 mg/dL (ref 0.00–0.40)
Total Protein: 7.3 g/dL (ref 6.0–8.5)

## 2024-08-14 LAB — NMR, LIPOPROFILE
Cholesterol, Total: 146 mg/dL (ref 100–199)
HDL Particle Number: 31.8 umol/L
HDL-C: 50 mg/dL
LDL Particle Number: 726 nmol/L
LDL Size: 21.7 nm
LDL-C (NIH Calc): 83 mg/dL (ref 0–99)
LP-IR Score: 31
Small LDL Particle Number: 262 nmol/L
Triglycerides: 67 mg/dL (ref 0–149)

## 2024-08-17 ENCOUNTER — Ambulatory Visit (HOSPITAL_BASED_OUTPATIENT_CLINIC_OR_DEPARTMENT_OTHER): Payer: Self-pay | Admitting: Nurse Practitioner

## 2024-08-18 ENCOUNTER — Other Ambulatory Visit (HOSPITAL_BASED_OUTPATIENT_CLINIC_OR_DEPARTMENT_OTHER): Payer: Self-pay | Admitting: *Deleted

## 2024-08-18 DIAGNOSIS — E785 Hyperlipidemia, unspecified: Secondary | ICD-10-CM

## 2024-08-18 DIAGNOSIS — I359 Nonrheumatic aortic valve disorder, unspecified: Secondary | ICD-10-CM

## 2024-08-18 DIAGNOSIS — E7841 Elevated Lipoprotein(a): Secondary | ICD-10-CM

## 2024-08-18 DIAGNOSIS — I2510 Atherosclerotic heart disease of native coronary artery without angina pectoris: Secondary | ICD-10-CM

## 2024-08-18 DIAGNOSIS — Z7189 Other specified counseling: Secondary | ICD-10-CM

## 2024-08-18 DIAGNOSIS — I700 Atherosclerosis of aorta: Secondary | ICD-10-CM

## 2024-08-18 MED ORDER — REPATHA SURECLICK 140 MG/ML ~~LOC~~ SOAJ: 140.0000 mg | SUBCUTANEOUS | 3 refills | Status: AC

## 2024-09-22 ENCOUNTER — Other Ambulatory Visit

## 2024-10-16 ENCOUNTER — Other Ambulatory Visit

## 2024-10-28 ENCOUNTER — Ambulatory Visit (HOSPITAL_BASED_OUTPATIENT_CLINIC_OR_DEPARTMENT_OTHER): Admitting: Pulmonary Disease

## 2024-11-04 ENCOUNTER — Ambulatory Visit (HOSPITAL_BASED_OUTPATIENT_CLINIC_OR_DEPARTMENT_OTHER): Admitting: Pulmonary Disease

## 2025-05-01 ENCOUNTER — Other Ambulatory Visit (HOSPITAL_BASED_OUTPATIENT_CLINIC_OR_DEPARTMENT_OTHER)
